# Patient Record
Sex: Male | Born: 1955 | Race: White | Hispanic: No | Marital: Married | Smoking: Never smoker
Health system: Southern US, Community
[De-identification: ages and names within clinical notes are randomized; demographics above are authoritative.]

## PROBLEM LIST (undated history)

## (undated) DIAGNOSIS — R072 Precordial pain: Secondary | ICD-10-CM

## (undated) DIAGNOSIS — R0609 Other forms of dyspnea: Secondary | ICD-10-CM

## (undated) DIAGNOSIS — R9431 Abnormal electrocardiogram [ECG] [EKG]: Secondary | ICD-10-CM

## (undated) DIAGNOSIS — R202 Paresthesia of skin: Secondary | ICD-10-CM

## (undated) DIAGNOSIS — K649 Unspecified hemorrhoids: Secondary | ICD-10-CM

## (undated) DIAGNOSIS — R972 Elevated prostate specific antigen [PSA]: Secondary | ICD-10-CM

## (undated) DIAGNOSIS — R2230 Localized swelling, mass and lump, unspecified upper limb: Secondary | ICD-10-CM

## (undated) DIAGNOSIS — I700 Atherosclerosis of aorta: Secondary | ICD-10-CM

## (undated) DIAGNOSIS — K219 Gastro-esophageal reflux disease without esophagitis: Secondary | ICD-10-CM

## (undated) DIAGNOSIS — R0683 Snoring: Secondary | ICD-10-CM

## (undated) DIAGNOSIS — M25519 Pain in unspecified shoulder: Secondary | ICD-10-CM

## (undated) DIAGNOSIS — R0600 Dyspnea, unspecified: Secondary | ICD-10-CM

## (undated) DIAGNOSIS — G8929 Other chronic pain: Secondary | ICD-10-CM

## (undated) DIAGNOSIS — R1011 Right upper quadrant pain: Secondary | ICD-10-CM

## (undated) DIAGNOSIS — R319 Hematuria, unspecified: Secondary | ICD-10-CM

## (undated) DIAGNOSIS — N182 Chronic kidney disease, stage 2 (mild): Secondary | ICD-10-CM

## (undated) DIAGNOSIS — R609 Edema, unspecified: Secondary | ICD-10-CM

## (undated) DIAGNOSIS — R0602 Shortness of breath: Secondary | ICD-10-CM

## (undated) DIAGNOSIS — R079 Chest pain, unspecified: Secondary | ICD-10-CM

## (undated) DIAGNOSIS — E785 Hyperlipidemia, unspecified: Secondary | ICD-10-CM

## (undated) DIAGNOSIS — R109 Unspecified abdominal pain: Secondary | ICD-10-CM

## (undated) DIAGNOSIS — D229 Melanocytic nevi, unspecified: Secondary | ICD-10-CM

## (undated) HISTORY — DX: Other chronic pain: G89.29

## (undated) HISTORY — DX: Shortness of breath: R06.02

## (undated) HISTORY — DX: Hyperlipidemia, unspecified: E78.5

## (undated) HISTORY — DX: Atherosclerosis of aorta: I70.0

## (undated) HISTORY — DX: Precordial pain: R07.2

## (undated) HISTORY — DX: Melanocytic nevi, unspecified: D22.9

## (undated) HISTORY — DX: Paresthesia of skin: R20.2

## (undated) HISTORY — DX: Abnormal electrocardiogram (ECG) (EKG): R94.31

## (undated) HISTORY — DX: Snoring: R06.83

## (undated) HISTORY — DX: Elevated prostate specific antigen (PSA): R97.20

## (undated) HISTORY — PX: HERNIA REPAIR: SHX51

## (undated) HISTORY — DX: Unspecified hemorrhoids: K64.9

## (undated) HISTORY — DX: Pain in unspecified shoulder: M25.519

## (undated) HISTORY — DX: Edema, unspecified: R60.9

## (undated) HISTORY — DX: Dyspnea, unspecified: R06.00

## (undated) HISTORY — DX: Chest pain, unspecified: R07.9

## (undated) HISTORY — DX: Right upper quadrant pain: R10.11

## (undated) HISTORY — DX: Unspecified abdominal pain: R10.9

## (undated) HISTORY — DX: Chronic kidney disease, stage 2 (mild): N18.2

## (undated) HISTORY — DX: Other forms of dyspnea: R06.09

## (undated) HISTORY — DX: Hematuria, unspecified: R31.9

---

## 2013-07-20 ENCOUNTER — Encounter: Payer: Self-pay | Admitting: Internal Medicine

## 2013-09-12 ENCOUNTER — Encounter: Payer: Self-pay | Admitting: Internal Medicine

## 2014-07-27 ENCOUNTER — Encounter: Payer: Self-pay | Admitting: Internal Medicine

## 2014-08-25 ENCOUNTER — Encounter: Payer: Self-pay | Admitting: Internal Medicine

## 2015-08-13 ENCOUNTER — Other Ambulatory Visit: Payer: Self-pay | Admitting: General Surgery

## 2015-09-28 ENCOUNTER — Encounter (HOSPITAL_BASED_OUTPATIENT_CLINIC_OR_DEPARTMENT_OTHER): Payer: Self-pay | Admitting: *Deleted

## 2015-10-04 ENCOUNTER — Ambulatory Visit (HOSPITAL_BASED_OUTPATIENT_CLINIC_OR_DEPARTMENT_OTHER)
Admission: RE | Admit: 2015-10-04 | Discharge: 2015-10-04 | Disposition: A | Discharge status: Home / Self Care | Payer: BLUE CROSS/BLUE SHIELD | Source: Ambulatory Visit | Attending: General Surgery | Admitting: General Surgery

## 2015-10-04 ENCOUNTER — Encounter (HOSPITAL_BASED_OUTPATIENT_CLINIC_OR_DEPARTMENT_OTHER)
Admission: RE | Disposition: A | Discharge status: Home / Self Care | Payer: Self-pay | Source: Ambulatory Visit | Attending: General Surgery

## 2015-10-04 ENCOUNTER — Ambulatory Visit (HOSPITAL_BASED_OUTPATIENT_CLINIC_OR_DEPARTMENT_OTHER): Payer: BLUE CROSS/BLUE SHIELD | Admitting: Anesthesiology

## 2015-10-04 ENCOUNTER — Encounter (HOSPITAL_BASED_OUTPATIENT_CLINIC_OR_DEPARTMENT_OTHER): Payer: Self-pay | Admitting: Anesthesiology

## 2015-10-04 DIAGNOSIS — D367 Benign neoplasm of other specified sites: Secondary | ICD-10-CM | POA: Insufficient documentation

## 2015-10-04 DIAGNOSIS — R222 Localized swelling, mass and lump, trunk: Secondary | ICD-10-CM | POA: Diagnosis present

## 2015-10-04 HISTORY — PX: MASS EXCISION: SHX2000

## 2015-10-04 HISTORY — DX: Localized swelling, mass and lump, unspecified upper limb: R22.30

## 2015-10-04 HISTORY — DX: Gastro-esophageal reflux disease without esophagitis: K21.9

## 2015-10-04 SURGERY — EXCISION MASS
Anesthesia: Monitor Anesthesia Care | Site: Axilla | Laterality: Left

## 2015-10-04 MED ORDER — LIDOCAINE 2% (20 MG/ML) 5 ML SYRINGE
INTRAMUSCULAR | Status: AC
  Filled 2015-10-04: qty 5

## 2015-10-04 MED ORDER — LIDOCAINE-EPINEPHRINE (PF) 1 %-1:200000 IJ SOLN
INTRAMUSCULAR | Status: DC | PRN
  Administered 2015-10-04: 5.25 mL

## 2015-10-04 MED ORDER — PROPOFOL 10 MG/ML IV BOLUS
INTRAVENOUS | Status: DC | PRN
  Administered 2015-10-04 (×4): 20 mg via INTRAVENOUS

## 2015-10-04 MED ORDER — CEFAZOLIN SODIUM-DEXTROSE 2-4 GM/100ML-% IV SOLN
INTRAVENOUS | Status: AC
  Filled 2015-10-04: qty 100

## 2015-10-04 MED ORDER — OXYCODONE HCL 5 MG PO TABS: 5.0000 mg | ORAL_TABLET | Freq: Once | ORAL | Status: DC | PRN

## 2015-10-04 MED ORDER — ONDANSETRON HCL 4 MG/2ML IJ SOLN
INTRAMUSCULAR | Status: AC
  Filled 2015-10-04: qty 2

## 2015-10-04 MED ORDER — FENTANYL CITRATE (PF) 100 MCG/2ML IJ SOLN
INTRAMUSCULAR | Status: AC
  Filled 2015-10-04: qty 2

## 2015-10-04 MED ORDER — MIDAZOLAM HCL 2 MG/2ML IJ SOLN
1.0000 mg | INTRAMUSCULAR | Status: DC | PRN
  Administered 2015-10-04: 2 mg via INTRAVENOUS

## 2015-10-04 MED ORDER — FENTANYL CITRATE (PF) 100 MCG/2ML IJ SOLN
50.0000 ug | INTRAMUSCULAR | Status: DC | PRN
  Administered 2015-10-04: 100 ug via INTRAVENOUS

## 2015-10-04 MED ORDER — EPHEDRINE 5 MG/ML INJ
INTRAVENOUS | Status: AC
  Filled 2015-10-04: qty 10

## 2015-10-04 MED ORDER — LACTATED RINGERS IV SOLN
INTRAVENOUS | Status: DC
  Administered 2015-10-04 (×2): via INTRAVENOUS

## 2015-10-04 MED ORDER — CEFAZOLIN SODIUM-DEXTROSE 2-4 GM/100ML-% IV SOLN
2.0000 g | INTRAVENOUS | Status: AC
  Administered 2015-10-04: 2 g via INTRAVENOUS

## 2015-10-04 MED ORDER — BUPIVACAINE HCL (PF) 0.25 % IJ SOLN
INTRAMUSCULAR | Status: DC | PRN
  Administered 2015-10-04: 5.25 mL/h

## 2015-10-04 MED ORDER — MIDAZOLAM HCL 2 MG/2ML IJ SOLN
INTRAMUSCULAR | Status: AC
  Filled 2015-10-04: qty 2

## 2015-10-04 MED ORDER — OXYCODONE HCL 5 MG/5ML PO SOLN: 5.0000 mg | Freq: Once | ORAL | Status: DC | PRN

## 2015-10-04 MED ORDER — GLYCOPYRROLATE 0.2 MG/ML IJ SOLN: 0.2000 mg | Freq: Once | INTRAMUSCULAR | Status: DC | PRN

## 2015-10-04 MED ORDER — SCOPOLAMINE 1 MG/3DAYS TD PT72: 1.0000 | MEDICATED_PATCH | Freq: Once | TRANSDERMAL | Status: DC | PRN

## 2015-10-04 MED ORDER — HYDROCODONE-ACETAMINOPHEN 5-325 MG PO TABS: 1.0000 | ORAL_TABLET | ORAL | Status: DC | PRN

## 2015-10-04 MED ORDER — FENTANYL CITRATE (PF) 100 MCG/2ML IJ SOLN: 25.0000 ug | INTRAMUSCULAR | Status: DC | PRN

## 2015-10-04 MED ORDER — SUCCINYLCHOLINE CHLORIDE 200 MG/10ML IV SOSY
PREFILLED_SYRINGE | INTRAVENOUS | Status: AC
  Filled 2015-10-04: qty 10

## 2015-10-04 MED ORDER — ONDANSETRON HCL 4 MG/2ML IJ SOLN: 4.0000 mg | Freq: Four times a day (QID) | INTRAMUSCULAR | Status: DC | PRN

## 2015-10-04 MED ORDER — PHENYLEPHRINE 40 MCG/ML (10ML) SYRINGE FOR IV PUSH (FOR BLOOD PRESSURE SUPPORT)
PREFILLED_SYRINGE | INTRAVENOUS | Status: AC
  Filled 2015-10-04: qty 10

## 2015-10-04 SURGICAL SUPPLY — 45 items
BLADE HEX COATED 2.75 (ELECTRODE) ×3 IMPLANT
BLADE SURG 10 STRL SS (BLADE) ×1 IMPLANT
BLADE SURG 15 STRL LF DISP TIS (BLADE) ×1 IMPLANT
BLADE SURG 15 STRL SS (BLADE) ×3
CANISTER SUCT 1200ML W/VALVE (MISCELLANEOUS) IMPLANT
CHLORAPREP W/TINT 26ML (MISCELLANEOUS) ×3 IMPLANT
COVER BACK TABLE 60X90IN (DRAPES) IMPLANT
COVER MAYO STAND STRL (DRAPES) IMPLANT
DECANTER SPIKE VIAL GLASS SM (MISCELLANEOUS) IMPLANT
DRAPE LAPAROTOMY 100X72 PEDS (DRAPES) IMPLANT
DRAPE UTILITY XL STRL (DRAPES) ×3 IMPLANT
ELECT REM PT RETURN 9FT ADLT (ELECTROSURGICAL) ×3
ELECTRODE REM PT RTRN 9FT ADLT (ELECTROSURGICAL) ×1 IMPLANT
GLOVE BIO SURGEON STRL SZ 6 (GLOVE) ×3 IMPLANT
GLOVE BIOGEL PI IND STRL 6.5 (GLOVE) ×1 IMPLANT
GLOVE BIOGEL PI INDICATOR 6.5 (GLOVE) ×2
GLOVE SURG SS PI 7.0 STRL IVOR (GLOVE) ×2 IMPLANT
GOWN STRL REUS W/ TWL LRG LVL3 (GOWN DISPOSABLE) ×1 IMPLANT
GOWN STRL REUS W/TWL 2XL LVL3 (GOWN DISPOSABLE) ×3 IMPLANT
GOWN STRL REUS W/TWL LRG LVL3 (GOWN DISPOSABLE) ×3
LIQUID BAND (GAUZE/BANDAGES/DRESSINGS) ×3 IMPLANT
NDL HYPO 25X1 1.5 SAFETY (NEEDLE) ×1 IMPLANT
NEEDLE HYPO 25X1 1.5 SAFETY (NEEDLE) ×3 IMPLANT
NS IRRIG 1000ML POUR BTL (IV SOLUTION) IMPLANT
PACK BASIN DAY SURGERY FS (CUSTOM PROCEDURE TRAY) ×3 IMPLANT
PACK UNIVERSAL I (CUSTOM PROCEDURE TRAY) ×2 IMPLANT
PENCIL BUTTON HOLSTER BLD 10FT (ELECTRODE) ×3 IMPLANT
SLEEVE SCD COMPRESS KNEE MED (MISCELLANEOUS) IMPLANT
SPONGE GAUZE 4X4 12PLY STER LF (GAUZE/BANDAGES/DRESSINGS) ×3 IMPLANT
SPONGE LAP 18X18 X RAY DECT (DISPOSABLE) ×3 IMPLANT
STAPLER VISISTAT 35W (STAPLE) IMPLANT
SUT MNCRL AB 4-0 PS2 18 (SUTURE) ×3 IMPLANT
SUT SILK 3 0 TIES 17X18 (SUTURE)
SUT SILK 3-0 18XBRD TIE BLK (SUTURE) IMPLANT
SUT VIC AB 2-0 SH 27 (SUTURE)
SUT VIC AB 2-0 SH 27XBRD (SUTURE) IMPLANT
SUT VIC AB 3-0 SH 27 (SUTURE) ×3
SUT VIC AB 3-0 SH 27X BRD (SUTURE) ×1 IMPLANT
SUT VICRYL 4-0 PS2 18IN ABS (SUTURE) ×3 IMPLANT
SYR CONTROL 10ML LL (SYRINGE) ×3 IMPLANT
TOWEL OR 17X24 6PK STRL BLUE (TOWEL DISPOSABLE) ×3 IMPLANT
TOWEL OR NON WOVEN STRL DISP B (DISPOSABLE) ×3 IMPLANT
TUBE CONNECTING 20'X1/4 (TUBING)
TUBE CONNECTING 20X1/4 (TUBING) IMPLANT
YANKAUER SUCT BULB TIP NO VENT (SUCTIONS) IMPLANT

## 2015-10-04 NOTE — Discharge Instructions (Addendum)
[place] Office Phone Number [phone]   POST OP INSTRUCTIONS  Always review your discharge instruction sheet given to you by the facility where your surgery was performed.  IF YOU HAVE DISABILITY OR FAMILY LEAVE FORMS, YOU MUST BRING THEM TO THE OFFICE FOR PROCESSING.  DO NOT GIVE THEM TO YOUR DOCTOR.  1. A prescription for pain medication may be given to you upon discharge.  Take your pain medication as prescribed, if needed.  If narcotic pain medicine is not needed, then you may take acetaminophen (Tylenol) or ibuprofen (Advil) as needed. 2. Take your usually prescribed medications unless otherwise directed 3. If you need a refill on your pain medication, please contact your pharmacy.  They will contact our office to request authorization.  Prescriptions will not be filled after 5pm or on week-ends. 4. You should eat very light the first 24 hours after surgery, such as soup, crackers, pudding, etc.  Resume your normal diet the day after surgery 5. It is common to experience some constipation if taking pain medication after surgery.  Increasing fluid intake and taking a stool softener will usually help or prevent this problem from occurring.  A mild laxative (Milk of Magnesia or Miralax) should be taken according to package directions if there are no bowel movements after 48 hours. 6. You may shower in 48 hours.  The surgical glue will flake off in 2-3 weeks.   7. ACTIVITIES:  No strenuous activity or heavy lifting for 1 week.   a. You may drive when you no longer are taking prescription pain medication, you can comfortably wear a seatbelt, and you can safely maneuver your car and apply brakes. b. RETURN TO WORK:  __________as tolerated.  _______________ Dennis Bast should see your doctor in the office for a follow-up appointment approximately three-four weeks after your surgery.    WHEN TO CALL YOUR DOCTOR: 1. Fever over 101.0 2. Nausea and/or vomiting. 3. Extreme swelling or  bruising. 4. Continued bleeding from incision. 5. Increased pain, redness, or drainage from the incision.  The clinic staff is available to answer your questions during regular business hours.  Please dont hesitate to call and ask to speak to one of the nurses for clinical concerns.  If you have a medical emergency, go to the nearest emergency room or call 911.  A surgeon from [facility] is always on call at the hospital.  For further questions, please visit [email or web address]    Post Anesthesia Home Care Instructions  Activity: Get plenty of rest for the remainder of the day. A responsible adult should stay with you for 24 hours following the procedure.  For the next 24 hours, DO NOT: -Drive a car -Paediatric nurse -Drink alcoholic beverages -Take any medication unless instructed by your physician -Make any legal decisions or sign important papers.  Meals: Start with liquid foods such as gelatin or soup. Progress to regular foods as tolerated. Avoid greasy, spicy, heavy foods. If nausea and/or vomiting occur, drink only clear liquids until the nausea and/or vomiting subsides. Call your physician if vomiting continues.  Special Instructions/Symptoms: Your throat may feel dry or sore from the anesthesia or the breathing tube placed in your throat during surgery. If this causes discomfort, gargle with warm salt water. The discomfort should disappear within 24 hours.  If you had a scopolamine patch placed behind your ear for the management of post- operative nausea and/or vomiting:  1. The medication in the patch is effective for 72 hours, after which  which it should be removed.  Wrap patch in a tissue and discard in the trash. Wash hands thoroughly with soap and water. °2. You may remove the patch earlier than 72 hours if you experience unpleasant side effects which may include dry mouth, dizziness or visual disturbances. °3. Avoid touching the patch. Wash your  hands with soap and water after contact with the patch. °  ° ° °

## 2015-10-04 NOTE — Anesthesia Postprocedure Evaluation (Signed)
Anesthesia Post Note  Patient: Timothy Watts  Procedure(s) Performed: Procedure(s) (LRB): EXCISION OF LEFT AXILLARY MASS (Left)  Patient location during evaluation: PACU Anesthesia Type: MAC Level of consciousness: awake and alert Pain management: pain level controlled Vital Signs Assessment: post-procedure vital signs reviewed and stable Respiratory status: spontaneous breathing, nonlabored ventilation, respiratory function stable and patient connected to nasal cannula oxygen Cardiovascular status: stable and blood pressure returned to baseline Anesthetic complications: no    Last Vitals:  Filed Vitals:   10/04/15 1030 10/04/15 1034  BP: 124/86   Pulse: 59 61  Temp:    Resp: 13 14    Last Pain:  Filed Vitals:   10/04/15 1034  PainSc: 0-No pain                 Chanae Gemma S

## 2015-10-04 NOTE — Transfer of Care (Signed)
Immediate Anesthesia Transfer of Care Note  Patient: Timothy Watts  Procedure(s) Performed: Procedure(s): EXCISION OF LEFT AXILLARY MASS (Left)  Patient Location: PACU  Anesthesia Type:MAC  Level of Consciousness: awake, alert  and oriented  Airway & Oxygen Therapy: Patient Spontanous Breathing and Patient connected to face mask oxygen  Post-op Assessment: Report given to RN and Post -op Vital signs reviewed and stable  Post vital signs: Reviewed and stable  Last Vitals:  Filed Vitals:   10/04/15 0747  BP: 117/79  Pulse: 59  Temp: 36.6 C  Resp: 18    Last Pain: There were no vitals filed for this visit.       Complications: No apparent anesthesia complications

## 2015-10-04 NOTE — Interval H&P Note (Signed)
History and Physical Interval Note:  10/04/2015 9:01 AM  Timothy Watts  has presented today for surgery, with the diagnosis of left subcutaneous axillary mass  The various methods of treatment have been discussed with the patient and family. After consideration of risks, benefits and other options for treatment, the patient has consented to  Procedure(s): EXCISION OF LEFT AXILLARY MASS (Left) as a surgical intervention .  The patient's history has been reviewed, patient examined, no change in status, stable for surgery.  I have reviewed the patient's chart and labs.  Questions were answered to the patient's satisfaction.     Niala Stcharles

## 2015-10-04 NOTE — Anesthesia Preprocedure Evaluation (Signed)
Anesthesia Evaluation  Patient identified by MRN, date of birth, ID band Patient awake    Reviewed: Allergy & Precautions, NPO status , Patient's Chart, lab work & pertinent test results  Airway Mallampati: II   Neck ROM: full    Dental   Pulmonary neg pulmonary ROS,    breath sounds clear to auscultation       Cardiovascular negative cardio ROS   Rhythm:regular Rate:Normal     Neuro/Psych    GI/Hepatic GERD  ,  Endo/Other    Renal/GU      Musculoskeletal   Abdominal   Peds  Hematology   Anesthesia Other Findings   Reproductive/Obstetrics                             Anesthesia Physical Anesthesia Plan  ASA: II  Anesthesia Plan: MAC   Post-op Pain Management:    Induction: Intravenous  Airway Management Planned: Simple Face Mask  Additional Equipment:   Intra-op Plan:   Post-operative Plan:   Informed Consent: I have reviewed the patients History and Physical, chart, labs and discussed the procedure including the risks, benefits and alternatives for the proposed anesthesia with the patient or authorized representative who has indicated his/her understanding and acceptance.     Plan Discussed with: CRNA, Anesthesiologist and Surgeon  Anesthesia Plan Comments:         Anesthesia Quick Evaluation

## 2015-10-04 NOTE — H&P (Signed)
Timothy Watts Location: [facility] Patient #: [id number] DOB: [birth year] Married / Language: English / Race: White Male   History of Present Illness The patient is a 60 year old male who presents with a complaint of Mass. Patient presents in consultation at the request of Dr. Philip Aspen due to a left axillary mass. This has been there for 1 - 1 1/2 years. He is not sure if it has gotten larger. He denies any trauma to the arm. He has no personal history of cancer. He denies any removal of moles on that side. He has no weight loss, fevers, or night sweats. He owns a supply store.    Other Problems Back Pain Hemorrhoids  Diagnostic Studies History Colonoscopy within last year  Allergies No Known Drug Allergies04/17/2017  Medication History  Sulfamethoxazole-Trimethoprim (800-160MG  Tablet, Oral) Active. Medications Reconciled  Social History  Alcohol use Occasional alcohol use. Caffeine use Carbonated beverages. No drug use Tobacco use Never smoker.  Family History Cancer Sister. Malignant Neoplasm Of Pancreas Mother. Prostate Cancer Father.    Review of Systems General Not Present- Appetite Loss, Chills, Fatigue, Fever, Night Sweats, Weight Gain and Weight Loss. Skin Not Present- Change in Wart/Mole, Dryness, Hives, Jaundice, New Lesions, Non-Healing Wounds, Rash and Ulcer. HEENT Present- Seasonal Allergies and Wears glasses/contact lenses. Not Present- Earache, Hearing Loss, Hoarseness, Nose Bleed, Oral Ulcers, Ringing in the Ears, Sinus Pain, Sore Throat, Visual Disturbances and Yellow Eyes. Respiratory Present- Snoring. Not Present- Bloody sputum, Chronic Cough, Difficulty Breathing and Wheezing. Breast Not Present- Breast Mass, Breast Pain, Nipple Discharge and Skin Changes. Cardiovascular Not Present- Chest Pain, Difficulty Breathing Lying Down, Leg Cramps, Palpitations, Rapid Heart Rate, Shortness of Breath and Swelling of  Extremities. Gastrointestinal Present- Hemorrhoids. Not Present- Abdominal Pain, Bloating, Bloody Stool, Change in Bowel Habits, Chronic diarrhea, Constipation, Difficulty Swallowing, Excessive gas, Gets full quickly at meals, Indigestion, Nausea, Rectal Pain and Vomiting. Male Genitourinary Not Present- Blood in Urine, Change in Urinary Stream, Frequency, Impotence, Nocturia, Painful Urination, Urgency and Urine Leakage. Musculoskeletal Present- Back Pain. Not Present- Joint Pain, Joint Stiffness, Muscle Pain, Muscle Weakness and Swelling of Extremities. Neurological Not Present- Decreased Memory, Fainting, Headaches, Numbness, Seizures, Tingling, Tremor, Trouble walking and Weakness. Psychiatric Not Present- Anxiety, Bipolar, Change in Sleep Pattern, Depression, Fearful and Frequent crying. Endocrine Not Present- Cold Intolerance, Excessive Hunger, Hair Changes, Heat Intolerance, Hot flashes and New Diabetes. Hematology Not Present- Easy Bruising, Excessive bleeding, Gland problems, HIV and Persistent Infections.  Vitals  Height: 69in Temp.: 7F(Temporal)  Pulse: 79 (Regular)  BP: 118/76 (Sitting, Left Arm, Standard)    Physical Exam General Mental Status-Alert. General Appearance-Consistent with stated age. Hydration-Well hydrated. Voice-Normal.  Integumentary Note: there is a 1x 2 cm soft mass in the distal portion of the left posterior axilla. It feels like this is potentially superficial. It does not feel like a epidermal inclusion cyst. It is also a little posterior to be a LN.   Head and Neck Head-normocephalic, atraumatic with no lesions or palpable masses. Trachea-midline. Thyroid Gland Characteristics - normal size and consistency.  Eye Eyeball - Bilateral-Extraocular movements intact. Sclera/Conjunctiva - Bilateral-No scleral icterus.  Chest and Lung Exam Chest and lung exam reveals -quiet, even and easy respiratory effort with no use of  accessory muscles and on auscultation, normal breath sounds, no adventitious sounds and normal vocal resonance. Inspection Chest Wall - Normal. Back - normal.  Cardiovascular Cardiovascular examination reveals -normal heart sounds, regular rate and rhythm with no murmurs and normal pedal pulses  bilaterally.  Abdomen Inspection Inspection of the abdomen reveals - No Hernias. Palpation/Percussion Palpation and Percussion of the abdomen reveal - Soft, Non Tender, No Rebound tenderness, No Rigidity (guarding) and No hepatosplenomegaly. Auscultation Auscultation of the abdomen reveals - Bowel sounds normal.  Neurologic Neurologic evaluation reveals -alert and oriented x 3 with no impairment of recent or remote memory. Mental Status-Normal.  Musculoskeletal Global Assessment -Note: no gross deformities.  Normal Exam - Left-Upper Extremity Strength Normal and Lower Extremity Strength Normal. Normal Exam - Right-Upper Extremity Strength Normal and Lower Extremity Strength Normal.  Lymphatic Head & Neck  General Head & Neck Lymphatics: Bilateral - Description - Normal. Axillary  General Axillary Region: Bilateral - Description - Normal. Tenderness - Non Tender. Femoral & Inguinal  Generalized Femoral & Inguinal Lymphatics: Bilateral - Description - No Generalized lymphadenopathy.    Assessment & Plan AXILLARY MASS, LEFT (R22.32) Impression: This is unlikely to be malignant, but is in an unusual location. I have seen breast cancer present in this location. Also, masses in this location can be deeper than they appear.  We will plan excision with light sedation and local.  Discussed risks and benefits. Pt wishes to proceed. Current Plans You are being scheduled for surgery - Our schedulers will call you.  You should hear from our office's scheduling department within 5 working days about the location, date, and time of surgery. We try to make accommodations for  patient's preferences in scheduling surgery, but sometimes the OR schedule or the surgeon's schedule prevents [place] from making those accommodations.  If you have not heard from our office [phone]) in 5 working days, call the office and ask for your surgeon's nurse.  If you have other questions about your diagnosis, plan, or surgery, call the office and ask for your surgeon's nurse.    Signed by Stark Klein, MD (08/13/2015 2:14 PM)

## 2015-10-04 NOTE — Op Note (Signed)
PRE-OPERATIVE DIAGNOSIS: left axillary mass  POST-OPERATIVE DIAGNOSIS:  Same  PROCEDURE:  Procedure(s): Excision of axillary mass, subcutaneous, 1.5 cm  SURGEON:  Surgeon(s): Stark Klein, MD  ANESTHESIA:   local and IV sedation  DRAINS: none   LOCAL MEDICATIONS USED:  MARCAINE    and LIDOCAINE   SPECIMEN:  Source of Specimen:  left axillary mass  DISPOSITION OF SPECIMEN:  PATHOLOGY  COUNTS:  YES  DICTATION: .Dragon Dictation  PLAN OF CARE: Discharge to home after PACU  PATIENT DISPOSITION:  PACU - hemodynamically stable.  FINDINGS:  Fatty mass c/w lipoma  EBL: min  PROCEDURE:   Pt was identified in the holding area then taken to the OR and placed supine on the operating room table.  General anesthesia was induced.  His left upper chest and arm were prepped and draped in sterile fashion. Timeout was performed according to the surgical safely checklist.    Local anesthesia was administered.  A incision was made in the skin lines of the axilla. Skin flaps were created with the cautery and skin hooks. The mass was just under the skin was was removed with the cautery.  The area was palpated to make sure no residual mass was remaining.  Hemostasis was achieved with cautery.  The skin was closed with 3-0 vicryl deep dermal interrupted suture and 4-0 monocryl in running subcuticular fashion.  The wound was then cleaned, dried, and dressed with dermabond.    Needle, sponge, and instrument counts were correct x 2.

## 2015-10-05 ENCOUNTER — Encounter (HOSPITAL_BASED_OUTPATIENT_CLINIC_OR_DEPARTMENT_OTHER): Payer: Self-pay | Admitting: General Surgery

## 2015-10-08 NOTE — Progress Notes (Signed)
Quick Note:  Please let patient know that pathology is benign. ______ 

## 2017-06-25 ENCOUNTER — Other Ambulatory Visit: Payer: Self-pay | Admitting: Internal Medicine

## 2017-06-25 DIAGNOSIS — R109 Unspecified abdominal pain: Secondary | ICD-10-CM

## 2017-06-25 DIAGNOSIS — R3121 Asymptomatic microscopic hematuria: Secondary | ICD-10-CM

## 2017-06-26 ENCOUNTER — Ambulatory Visit
Admission: RE | Admit: 2017-06-26 | Discharge: 2017-06-26 | Disposition: A | Discharge status: Home / Self Care | Payer: PRIVATE HEALTH INSURANCE | Source: Ambulatory Visit | Attending: Internal Medicine | Admitting: Internal Medicine

## 2017-06-26 DIAGNOSIS — R109 Unspecified abdominal pain: Secondary | ICD-10-CM

## 2017-06-26 DIAGNOSIS — R3121 Asymptomatic microscopic hematuria: Secondary | ICD-10-CM

## 2019-12-22 LAB — IFOBT (OCCULT BLOOD): IFOBT: NEGATIVE

## 2020-07-08 NOTE — Progress Notes (Signed)
CARDIOLOGY CONSULT NOTE       Patient ID: Timothy Watts MRN: [id number] DOB/AGE: 1955-06-13 65 y.o.  Admit date: (Not on file) Referring Physician: Philip Aspen Primary Physician: Leanna Battles, MD Primary Cardiologist: New Reason for Consultation: Abnormal ECG   Active Problems:   * No active hospital problems. *   HPI:  65 y.o. referred by Dr Philip Aspen for abnormal ECG He has been having exertional dyspnea Had ECG as part of insurance physical and told it was abnormal Gets winded going up stairs and playing tennis Also rides elliptical 3x/week No chest pain , palpitations, edema Non smoker No family history of premature CAD LDL is 127 not on statin Rx Had CT abdomen for flank pain 2019 noted some aortic atherosclerosis Review of ECG from primary 06/25/20 showed SR rate 79 with LBBB  No history of cardiac issues Father alive at 42 no family history. 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 Place golf at [employer] and [place] Has a house at [facility] and enjoys [employer]. Has two children both accountants   ROS All other systems reviewed and negative except as noted above  Past Medical History:  Diagnosis Date  . Abnormal EKG   . Atherosclerosis of aorta ([place])   . Axillary mass    left  . Benign skin mole   . Chest pain   . CKD (chronic kidney disease), stage II   . Dyspnea on exertion   . Elbow pain, chronic, right   . Flank pain   . GERD (gastroesophageal reflux disease)   . Hematuria   . Hemorrhoids   . Hyperlipidemia   . Paresthesia   . Precordial pain   . PSA elevation   . PSA elevation   . RUQ pain   . Shoulder pain   . Snoring   . SOB (shortness of breath) on exertion   . Swelling     Family History  Problem Relation Age of Onset  . Pancreatic cancer Mother   . Stroke Father   . Throat cancer Sister     Social History   Socioeconomic History   . Marital status: Married    Spouse name: Not on file  . Number of children: 2  . Years of education: Not on file  . Highest education level: Not on file  Occupational History  . Occupation: Engineer, site  Tobacco Use  . Smoking status: Never Smoker  . Smokeless tobacco: Never Used  Substance and Sexual Activity  . Alcohol use: Yes    Comment: social  . Drug use: No  . Sexual activity: Not on file  Other Topics Concern  . Not on file  Social History Narrative  . Not on file   Social Determinants of Health   Financial Resource Strain: Not on file  Food Insecurity: Not on file  Transportation Needs: Not on file  Physical Activity: Not on file  Stress: Not on file  Social Connections: Not on file  Intimate Partner Violence: Not on file    Past Surgical History:  Procedure Laterality Date  . HERNIA REPAIR     UHR  . MASS EXCISION Left 10/04/2015   Procedure: EXCISION OF LEFT AXILLARY MASS;  Surgeon: Stark Klein, MD;  Location: [facility];  Service: General;  Laterality: Left;     No current outpatient medications on file.  Physical Exam: Blood pressure 110/84, pulse 66, height 5' 9.5" (1.765 m), weight 79.8 kg, SpO2 98 %.   Affect appropriate Healthy:  appears stated age 65: normal Neck supple with no adenopathy JVP normal no bruits no thyromegaly Lungs clear with no wheezing and good diaphragmatic motion Heart:  S1/S2 no murmur, no rub, gallop or click PMI normal Abdomen: benighn, BS positve, no tenderness, no AAA no bruit.  No HSM or HJR Distal pulses intact with no bruits No edema Neuro non-focal Skin warm and dry No muscular weakness   Labs:  No results found for: WBC, HGB, HCT, MCV, PLT No results for input(s): NA, K, CL, CO2, BUN, CREATININE, CALCIUM, PROT, BILITOT, ALKPHOS, ALT, AST, GLUCOSE in the last 168 hours.  Invalid input(s): LABALBU No results found for: CKTOTAL, CKMB, CKMBINDEX, TROPONINI No results found for:  CHOL No results found for: HDL No results found for: LDLCALC No results found for: TRIG No results found for: CHOLHDL No results found for: LDLDIRECT    Radiology: No results found.  EKG: SR LBBB    ASSESSMENT AND PLAN:   1. Abnormal ECG:  New LBBB Need to r/o structural heart disease I.e. cardiomyopathy especially  In absence of chronic HTN.  Echo. R/O CAD with cardiac CTA 2. Dyspnea: seems functional f/u echo  3. HLD:  LDL 127 noted aortic atherosclerosis on CT 2019 will see what calcium score and cardiac CTA look like in regard to need for statin Rx  Cardiac CTA Lopressor 75 mg 2 hours before BMET Echo  F/U with me after testing    Signed: Jenkins Rouge 07/19/2020, 3:54 PM

## 2020-07-19 ENCOUNTER — Other Ambulatory Visit: Payer: Self-pay

## 2020-07-19 ENCOUNTER — Ambulatory Visit (INDEPENDENT_AMBULATORY_CARE_PROVIDER_SITE_OTHER): Payer: No Typology Code available for payment source | Admitting: Cardiovascular Disease

## 2020-07-19 ENCOUNTER — Encounter: Payer: Self-pay | Admitting: Cardiovascular Disease

## 2020-07-19 VITALS — BP 110/84 | HR 66 | Ht 69.5 in | Wt 176.0 lb

## 2020-07-19 DIAGNOSIS — I447 Left bundle-branch block, unspecified: Secondary | ICD-10-CM

## 2020-07-19 DIAGNOSIS — R9431 Abnormal electrocardiogram [ECG] [EKG]: Secondary | ICD-10-CM

## 2020-07-19 DIAGNOSIS — R079 Chest pain, unspecified: Secondary | ICD-10-CM

## 2020-07-19 DIAGNOSIS — R0600 Dyspnea, unspecified: Secondary | ICD-10-CM

## 2020-07-19 MED ORDER — METOPROLOL TARTRATE 50 MG PO TABS: ORAL_TABLET | ORAL | 0 refills | Status: DC

## 2020-07-19 NOTE — Patient Instructions (Signed)
Medication Instructions:  *If you need a refill on your cardiac medications before your next appointment, please call your pharmacy*  Lab Work: Your physician recommends that you return for lab work to have BMET done before cardiac CT.  If you have labs (blood work) drawn today and your tests are completely normal, you will receive your results only by: Marland Kitchen MyChart Message (if you have MyChart) OR . A paper copy in the mail If you have any lab test that is abnormal or we need to change your treatment, we will call you to review the results.  Testing/Procedures: Your physician has requested that you have cardiac CT. Cardiac computed tomography (CT) is a painless test that uses an x-Weylin machine to take clear, detailed pictures of your heart. For further information please visit [email or web address]. Please follow instruction sheet as given.  Your physician has requested that you have an echocardiogram. Echocardiography is a painless test that uses sound waves to create images of your heart. It provides your doctor with information about the size and shape of your heart and how well your heart's chambers and valves are working. This procedure takes approximately one hour. There are no restrictions for this procedure.  Follow-Up: At [facility], you and your health needs are our priority.  As part of our continuing mission to provide you with exceptional heart care, we have created designated Provider Care Teams.  These Care Teams include your primary Cardiologist (physician) and Advanced Practice Providers (APPs -  Physician Assistants and Nurse Practitioners) who all work together to provide you with the care you need, when you need it.  We recommend signing up for the patient portal called "MyChart".  Sign up information is provided on this After Visit Summary.  MyChart is used to connect with patients for Virtual Visits (Telemedicine).  Patients are able to view lab/test results, encounter notes,  upcoming appointments, etc.  Non-urgent messages can be sent to your provider as well.   To learn more about what you can do with MyChart, go to [email or web address].    Your next appointment:   2 month(s)  The format for your next appointment:   In Person  Provider:   You may see Dr. Johnsie Cancel or one of the following Advanced Practice Providers on your designated Care Team:    Kathyrn Drown, NP    Other Instructions Your cardiac CT will be scheduled at one of the below locations:   [facility] [address] [place], [place] [address] [phone]   If scheduled at [facility], please arrive at the [facility] main entrance (entrance A) of [facility] 30 minutes prior to test start time. Proceed to the [facility] Radiology Department (first floor) to check-in and test prep.   Please follow these instructions carefully (unless otherwise directed):  Hold all erectile dysfunction medications at least 3 days (72 hrs) prior to test.  On the Night Before the Test: . Be sure to Drink plenty of water. . Do not consume any caffeinated/decaffeinated beverages or chocolate 12 hours prior to your test. . Do not take any antihistamines 12 hours prior to your test.  On the Day of the Test: . Drink plenty of water until 1 hour prior to the test. . Do not eat any food 4 hours prior to the test. . You may take your regular medications prior to the test.  . Take metoprolol (Lopressor) 75 mg two hours prior to test.   After the Test: .  Drink plenty of water. . After receiving IV contrast, you may experience a mild flushed feeling. This is normal. . On occasion, you may experience a mild rash up to 24 hours after the test. This is not dangerous. If this occurs, you can take Benadryl 25 mg and increase your fluid intake. . If you experience trouble breathing, this can be serious. If it is severe call 911 IMMEDIATELY. If it is mild, please call our  office.  Once we have confirmed authorization from your insurance company, we will call you to set up a date and time for your test. Based on how quickly your insurance processes prior authorizations requests, please allow up to 4 weeks to be contacted for scheduling your Cardiac CT appointment. Be advised that routine Cardiac CT appointments could be scheduled as many as 8 weeks after your provider has ordered it.  For non-scheduling related questions, please contact the cardiac imaging nurse navigator should you have any questions/concerns: Marchia Bond, Cardiac Imaging Nurse Navigator Gordy Clement, Cardiac Imaging Nurse Navigator [facility] Direct Office Dial: [phone]   For scheduling needs, including cancellations and rescheduling, please call [place], [phone].

## 2020-07-27 ENCOUNTER — Telehealth: Payer: Self-pay

## 2020-07-27 NOTE — Telephone Encounter (Signed)
-----   Message from Melony Overly sent at 07/27/2020  3:48 PM EDT ----- Regarding: ct heart Scheduled 08/07/20 at 7:45..   He will need labs done    Thanks, [place]

## 2020-07-27 NOTE — Telephone Encounter (Signed)
Called patient to schedule lab work. Left message to call back. Order is already in for lab work.

## 2020-07-30 NOTE — Telephone Encounter (Signed)
Pt calling back as instructed to last Friday, to schedule his BMET for upcoming Cardiac CT on 4/12.  Scheduled the pt to come into the office for BMET on tomorrow 4/5.  Pt verbalized understanding and agrees with this plan.

## 2020-07-30 NOTE — Telephone Encounter (Signed)
Pt is returning a call for Pam from Friday, he would like a return call on his cell phone please Thanks

## 2020-07-31 ENCOUNTER — Other Ambulatory Visit: Payer: Self-pay

## 2020-07-31 ENCOUNTER — Other Ambulatory Visit: Payer: No Typology Code available for payment source

## 2020-07-31 DIAGNOSIS — R0600 Dyspnea, unspecified: Secondary | ICD-10-CM

## 2020-07-31 DIAGNOSIS — R079 Chest pain, unspecified: Secondary | ICD-10-CM

## 2020-07-31 DIAGNOSIS — I447 Left bundle-branch block, unspecified: Secondary | ICD-10-CM

## 2020-07-31 DIAGNOSIS — R9431 Abnormal electrocardiogram [ECG] [EKG]: Secondary | ICD-10-CM

## 2020-07-31 LAB — BASIC METABOLIC PANEL
BUN/Creatinine Ratio: 16 (ref 10–24)
BUN: 17 mg/dL (ref 8–27)
CO2: 27 mmol/L (ref 20–29)
Calcium: 8.9 mg/dL (ref 8.6–10.2)
Chloride: 104 mmol/L (ref 96–106)
Creatinine, Ser: 1.07 mg/dL (ref 0.76–1.27)
Glucose: 84 mg/dL (ref 65–99)
Potassium: 4.2 mmol/L (ref 3.5–5.2)
Sodium: 142 mmol/L (ref 134–144)
eGFR: 78 mL/min/{1.73_m2} (ref >59)

## 2020-08-03 ENCOUNTER — Telehealth (HOSPITAL_COMMUNITY): Payer: Self-pay | Admitting: Emergency Medicine

## 2020-08-03 NOTE — Telephone Encounter (Signed)
Reaching out to patient to offer assistance regarding upcoming cardiac imaging study; pt verbalizes understanding of appt date/time, parking situation and where to check in, pre-test NPO status and medications ordered, and verified current allergies; name and call back number provided for further questions should they arise Marchia Bond RN Navigator Cardiac Imaging [facility] [phone] office [phone] cell   75mg  metoprolol 2 hr prior

## 2020-08-07 ENCOUNTER — Other Ambulatory Visit: Payer: Self-pay

## 2020-08-07 ENCOUNTER — Encounter: Payer: No Typology Code available for payment source | Admitting: *Deleted

## 2020-08-07 ENCOUNTER — Ambulatory Visit (HOSPITAL_COMMUNITY)
Admission: RE | Admit: 2020-08-07 | Discharge: 2020-08-07 | Disposition: A | Discharge status: Home / Self Care | Payer: No Typology Code available for payment source | Source: Ambulatory Visit | Attending: Cardiovascular Disease | Admitting: Cardiovascular Disease

## 2020-08-07 DIAGNOSIS — R079 Chest pain, unspecified: Secondary | ICD-10-CM

## 2020-08-07 DIAGNOSIS — Z006 Encounter for examination for normal comparison and control in clinical research program: Secondary | ICD-10-CM

## 2020-08-07 IMAGING — CT CT HEART MORP W/ CTA COR W/ SCORE W/ CA W/CM &/OR W/O CM
4 of 7 series · 8 of 20 positions shown, 9 images · IV contrast (APPLIED)
Comparison: CT abdomen and pelvis 06/26/2017
COMPARISON: CT abdomen and pelvis 06/26/2017

Addendum:
EXAM:
OVER-READ INTERPRETATION  CT CHEST

The following report is an over-read performed by radiologist Dr.
Deontae Pringle [REDACTED] on 08/07/2020. This over-read
does not include interpretation of cardiac or coronary anatomy or
pathology. The coronary calcium score/coronary CTA interpretation by
the cardiologist is attached.
HISTORY: Chest pain/anginal equiv, ECGs or troponins abnormal Dyspnea on
exertion (TAT) Cardiomyopathy suspected
Cardiac/Coronary CT
TECHNIQUE: The patient was scanned on a Siemens Force scanner.
PROTOCOL: A 100 kV prospective scan was triggered in the descending thoracic
aorta at 111 HU's. Axial non-contrast 3 mm slices were carried out
through the heart. The data set was analyzed on a dedicated work
station and scored using the Agatson method. Gantry rotation speed
was 250 msecs and collimation was 0.6 mm. Heart rate optimized
medically, and 0.8 mg of sublingual nitroglycerin was given. The 3D
data set was reconstructed in 5% intervals of 35-75% of the R-R
cycle. Diastolic phases were analyzed on a dedicated work station
using MPR, MIP and VRT modes. The patient received 90mL OMNIPAQUE
IOHEXOL 350 MG/ML SOLN of contrast.

[Series 6: best diast · axial · 0.39mm/px · z∈[+1102,+1146]mm · 2 of 331 slices shown, 3 images]
[im 111/331  vessel]
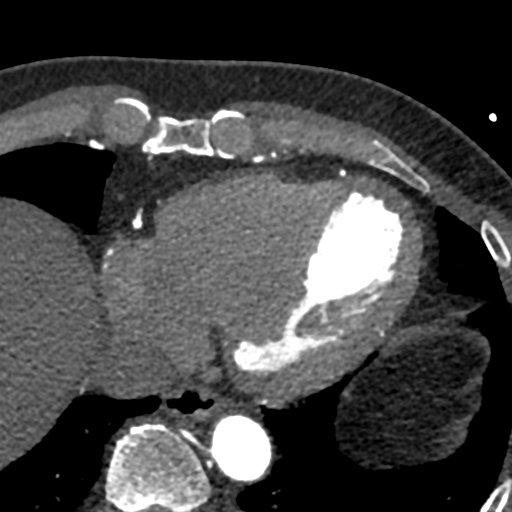
[im 111/331  lung]
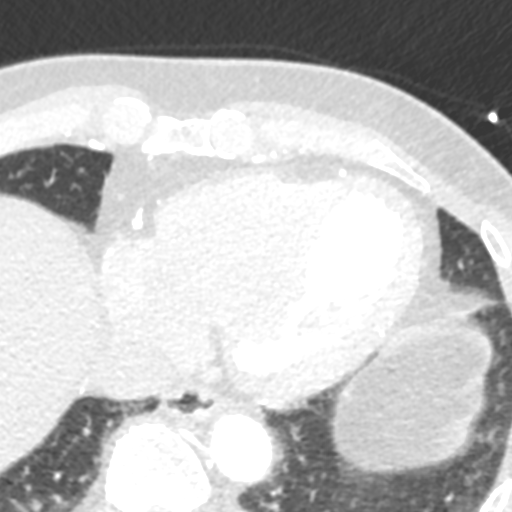
[im 221/331  vessel]
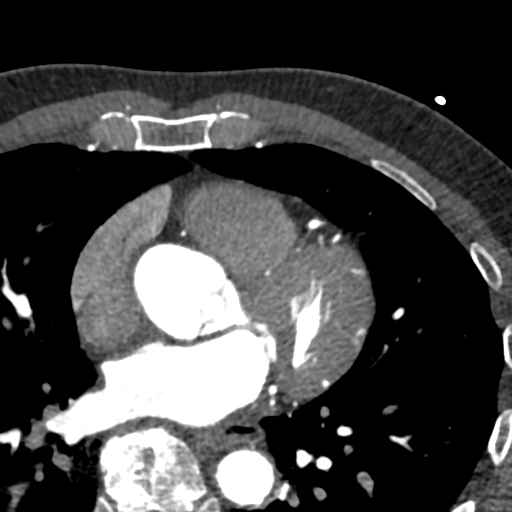

[Series 7: best syst · axial · 0.39mm/px · z∈[+1102,+1146]mm · 2 of 331 slices shown]
[im 111/331  vessel]
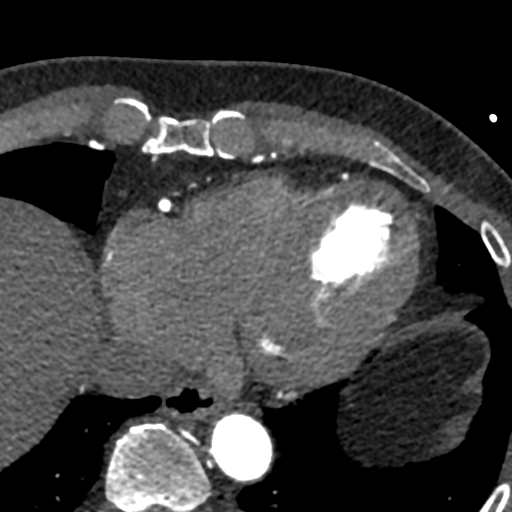
[im 221/331  vessel]
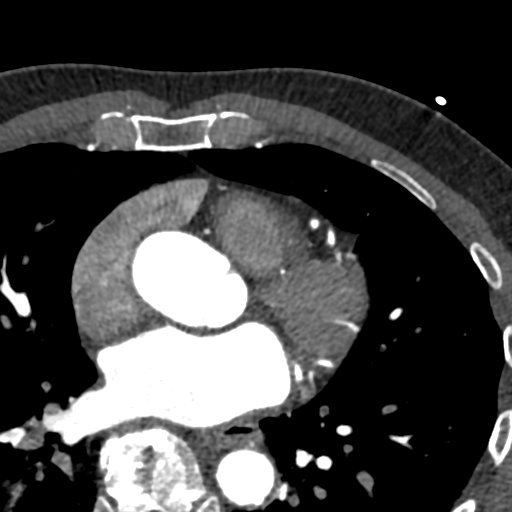

[Series 8: ts diast sharp 75 % · axial · 0.39mm/px · z∈[+1102,+1146]mm · 2 of 331 slices shown]
[im 111/331  lung]
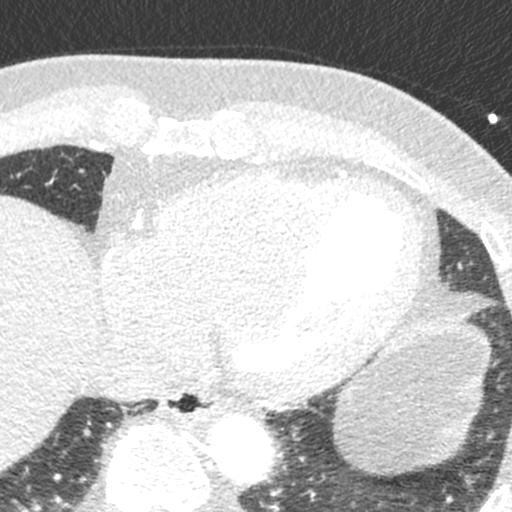
[im 221/331  lung]
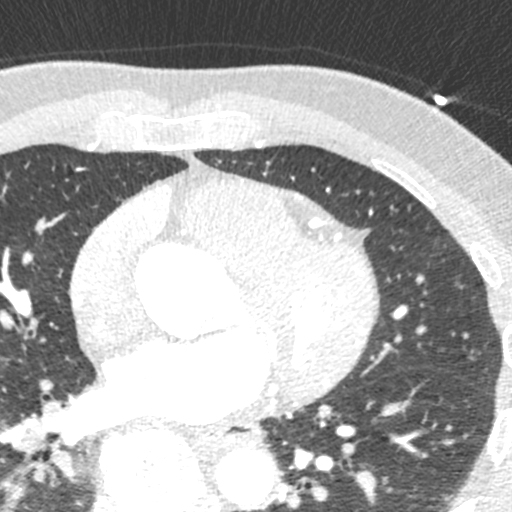

[Series 9: ts syst sharp · axial · 0.39mm/px · z∈[+1102,+1146]mm · 2 of 331 slices shown]
[im 111/331  lung]
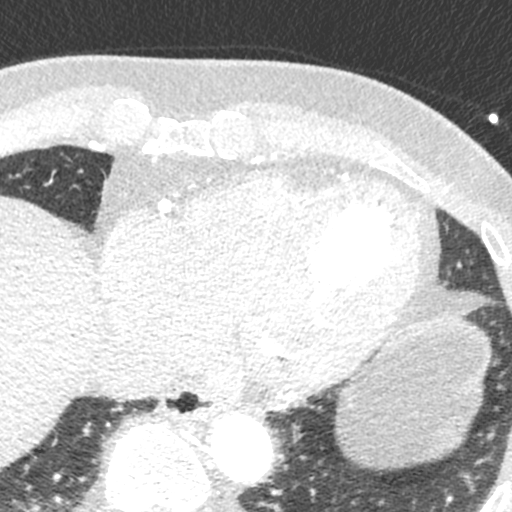
[im 221/331  lung]
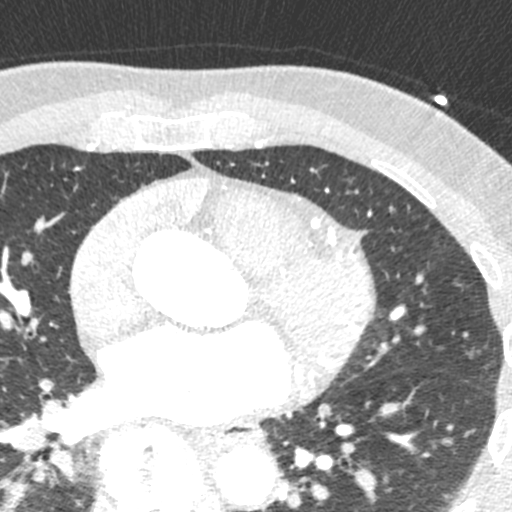

[8 of 20 positions shown; findings below may reference images not displayed]

FINDINGS: Vascular: Ascending thoracic aorta measures 3.8 cm. No evidence for
a dissection involving the visualized thoracic aorta. Pulmonary
arteries are poorly opacified on this examination.

Mediastinum/Nodes: Visualized mediastinal structures are
unremarkable.

Lungs/Pleura: Dependent atelectasis in the lower lobes. No large
pleural effusions.

Upper Abdomen: Visualized upper abdominal structures are
unremarkable.

Musculoskeletal: No acute bone abnormality.
IMPRESSION: No acute abnormality involving the extracardiac structures.
FINDINGS: Coronary calcium score: The patient's coronary artery calcium score
is 2, which places the patient in the 2 percentile.

Coronary arteries: Normal coronary origins.  Right dominance.

Right Coronary Artery: Normal caliber vessel, gives rise to PDA. No
significant plaque or stenosis.

Left Main Coronary Artery: Normal caliber vessel. No significant
plaque or stenosis.

Left Anterior Descending Coronary Artery: Normal caliber vessel.
Trivial calcified plaque in the proximal LAD with minimal stenosis
(1-24%). Gives rise to 3 diagonal branches, with D1 being a large
branch with sub-branches.

Left Circumflex Artery: Normal caliber vessel. No significant plaque
or stenosis. Gives rise to 4 OM branches. OM1 is nearly a ramus
intermedius.

Aorta: Normal size, 38 mm at the mid ascending aorta (level of the
PA bifurcation) measured double oblique. No calcifications. No
dissection.

Aortic Valve: No calcifications. Trileaflet.

Other findings:

Normal pulmonary vein drainage into the left atrium.

Normal left atrial appendage without a thrombus.

Normal size of the pulmonary artery.

Small PFO without evidence of shunting.
IMPRESSION: 1.  Minimal nonobstructive CAD, CADRADS = 1.

2. Coronary calcium score of 2. This was 2nd percentile for age and
sex matched control.

3. Normal coronary origin with right dominance.

4.  Small PFO without evidence of shunting.

INTERPRETATION:

1. CAD-RADS 0: No evidence of CAD (0%). Consider non-atherosclerotic
causes of chest pain.

2. CAD-RADS 1: Minimal non-obstructive CAD (0-24%). Consider
non-atherosclerotic causes of chest pain. Consider preventive
therapy and risk factor modification.

3. CAD-RADS 2: Mild non-obstructive CAD (25-49%). Consider
non-atherosclerotic causes of chest pain. Consider preventive
therapy and risk factor modification.

4. CAD-RADS 3: Moderate stenosis (50-69%). Consider symptom-guided
anti-ischemic pharmacotherapy as well as risk factor modification
per guideline directed care. Additional analysis with CT FFR will be
submitted.

5. CAD-RADS 4: Severe stenosis. (70-99% or > 50% left main). Cardiac
catheterization or CT FFR is recommended. Consider symptom-guided
anti-ischemic pharmacotherapy as well as risk factor modification
per guideline directed care. Invasive coronary angiography
recommended with revascularization per published guideline
statements.

6. CAD-RADS 5: Total coronary occlusion (100%). Consider cardiac
catheterization or viability assessment. Consider symptom-guided
anti-ischemic pharmacotherapy as well as risk factor modification
per guideline directed care.

7. CAD-RADS N: Non-diagnostic study. Obstructive CAD can't be
excluded. Alternative evaluation is recommended.

*** End of Addendum ***
EXAM:
OVER-READ INTERPRETATION  CT CHEST

The following report is an over-read performed by radiologist Dr.
Deontae Pringle [REDACTED] on 08/07/2020. This over-read
does not include interpretation of cardiac or coronary anatomy or
pathology. The coronary calcium score/coronary CTA interpretation by
the cardiologist is attached.
FINDINGS: Vascular: Ascending thoracic aorta measures 3.8 cm. No evidence for
a dissection involving the visualized thoracic aorta. Pulmonary
arteries are poorly opacified on this examination.

Mediastinum/Nodes: Visualized mediastinal structures are
unremarkable.

Lungs/Pleura: Dependent atelectasis in the lower lobes. No large
pleural effusions.

Upper Abdomen: Visualized upper abdominal structures are
unremarkable.

Musculoskeletal: No acute bone abnormality.
IMPRESSION: No acute abnormality involving the extracardiac structures.

## 2020-08-07 MED ORDER — NITROGLYCERIN 0.4 MG SL SUBL
SUBLINGUAL_TABLET | SUBLINGUAL | Status: AC
  Filled 2020-08-07: qty 2

## 2020-08-07 MED ORDER — NITROGLYCERIN 0.4 MG SL SUBL
0.8000 mg | SUBLINGUAL_TABLET | Freq: Once | SUBLINGUAL | Status: AC
  Administered 2020-08-07: 0.8 mg via SUBLINGUAL

## 2020-08-07 MED ORDER — IOHEXOL 350 MG/ML SOLN
90.0000 mL | Freq: Once | INTRAVENOUS | Status: AC | PRN
  Administered 2020-08-07: 90 mL via INTRAVENOUS

## 2020-08-07 NOTE — Research (Signed)
IDENTIFY Informed Consent   Subject Name: Timothy Watts  Subject met inclusion and exclusion criteria.  The informed consent form, study requirements and expectations were reviewed with the subject and questions and concerns were addressed prior to the signing of the consent form.  The subject verbalized understanding of the trial requirements.  The subject agreed to participate in the IDENTIFY trial and signed the informed consent at 0705 on 08/07/2020.  The informed consent was obtained prior to performance of any protocol-specific procedures for the subject.  A copy of the signed informed consent was given to the subject and a copy was placed in the subject's medical record.   Philemon Kingdom D

## 2020-08-21 ENCOUNTER — Ambulatory Visit (HOSPITAL_COMMUNITY): Payer: No Typology Code available for payment source | Attending: Cardiology

## 2020-08-21 ENCOUNTER — Other Ambulatory Visit: Payer: Self-pay

## 2020-08-21 DIAGNOSIS — I447 Left bundle-branch block, unspecified: Secondary | ICD-10-CM | POA: Diagnosis present

## 2020-08-21 DIAGNOSIS — R9431 Abnormal electrocardiogram [ECG] [EKG]: Secondary | ICD-10-CM

## 2020-08-21 DIAGNOSIS — R079 Chest pain, unspecified: Secondary | ICD-10-CM | POA: Diagnosis present

## 2020-08-21 DIAGNOSIS — R0600 Dyspnea, unspecified: Secondary | ICD-10-CM

## 2020-08-21 LAB — ECHOCARDIOGRAM COMPLETE
Area-P 1/2: 2.62 cm2
S' Lateral: 1.9 cm

## 2020-09-16 NOTE — Progress Notes (Signed)
Cardiology Office Note   Date:  09/19/2020   ID:  Timothy Watts, DOB 01/12/1956, MRN [id number]  PCP:  Timothy Battles, MD  Cardiologist: Dr. Johnsie Cancel, MD    Chief Complaint  Patient presents with  . Follow-up   History of Present Illness: Timothy Watts is a 65 y.o. male who presents for follow up, seen for Dr. Johnsie Watts.  Timothy Watts was initially referred for an abnormal EKG with exertional dyspnea performed insurance and physical.  He is very active at baseline, rides an elliptical 3 times a week, plays golf.  He has no family history of premature CAD.  LDL on last lipid panel was 127.  He did have a abdominal CT in 2019 which showed some aortic atherosclerosis.  He was last seen by Dr. Johnsie Watts 07/19/2020 at which time EKG was noted to have new LBBB therefore an echocardiogram was performed which showed an LVEF at 65 to 70% with no regional wall motion abnormalities and G1 DD with no evidence of valvular disease.  CTA performed 08/07/2020 that showed a coronary calcium score of 2 which places the patient in the 2nd percentile for age and sex matched control.  There was normal coronary artery origin with very minimal nonobstructive CAD.  There was noted a small PFO without evidence of shunting.   Today he presents for follow up of testing and states that he is doing very well from a CV standpoint. He continues to have some DOE however now that his CV tests have come back normal, feels that it could be from asthma as it typically occurs in very hot/humid weather. We discussed the possibility of adding an inhaler per his PCP if needed. BP is great today. Remains active an is not on any medications.   Past Medical History:  Diagnosis Date  . Abnormal EKG   . Atherosclerosis of aorta ([place])   . Axillary mass    left  . Benign skin mole   . Chest pain   . CKD (chronic kidney disease), stage II   . Dyspnea on exertion   . Elbow pain, chronic, right   . Flank pain   . GERD (gastroesophageal  reflux disease)   . Hematuria   . Hemorrhoids   . Hyperlipidemia   . Paresthesia   . Precordial pain   . PSA elevation   . PSA elevation   . RUQ pain   . Shoulder pain   . Snoring   . SOB (shortness of breath) on exertion   . Swelling     Past Surgical History:  Procedure Laterality Date  . HERNIA REPAIR     UHR  . MASS EXCISION Left 10/04/2015   Procedure: EXCISION OF LEFT AXILLARY MASS;  Surgeon: Stark Klein, MD;  Location: [facility];  Service: General;  Laterality: Left;     No current outpatient medications on file.   No current facility-administered medications for this visit.    Allergies:   Patient has no known allergies.    Social History:  The patient  reports that he has never smoked. He has never used smokeless tobacco. He reports current alcohol use. He reports that he does not use drugs.   Family History:  The patient's family history includes Pancreatic cancer in his mother; Stroke in his father; Throat cancer in his sister.    ROS:  Please see the history of present illness. Otherwise, review of systems are positive for none.   All other systems are  reviewed and negative.    PHYSICAL EXAM: VS:  BP 110/70   Pulse 63   Ht 5' 9.5" (1.765 m)   Wt 175 lb 3.2 oz (79.5 kg)   SpO2 99%   BMI 25.50 kg/m  , BMI Body mass index is 25.5 kg/m.   General: Well developed, well nourished, NAD Neck: Negative for carotid bruits. No JVD Lungs:Clear to ausculation bilaterally. No wheezes, rales, or rhonchi. Breathing is unlabored. Cardiovascular: RRR with S1 S2. No murmurs Extremities: No edema Neuro: Alert and oriented. No focal deficits. No facial asymmetry. MAE spontaneously. Psych: Responds to questions appropriately with normal affect.    EKG:  EKG is not ordered today.  Recent Labs: 07/31/2020: BUN 17; Creatinine, Ser 1.07; Potassium 4.2; Sodium 142    Lipid Panel No results found for: CHOL, TRIG, HDL, CHOLHDL, VLDL, LDLCALC, LDLDIRECT    Wt Readings from Last 3 Encounters:  09/19/20 175 lb 3.2 oz (79.5 kg)  07/19/20 176 lb (79.8 kg)  10/04/15 174 lb 8 oz (79.2 kg)     Other studies Reviewed: Additional studies/ records that were reviewed today include:  Review of the above records demonstrates:   Echocardiogram 08/21/2020:  1. Left ventricular ejection fraction, by estimation, is 65 to 70%. The  left ventricle has normal function. The left ventricle has no regional  wall motion abnormalities. Left ventricular diastolic parameters are  consistent with Grade I diastolic  dysfunction (impaired relaxation). Elevated left ventricular end-diastolic  pressure.  2. Right ventricular systolic function is normal. The right ventricular  size is normal. There is normal pulmonary artery systolic pressure. The  estimated right ventricular systolic pressure is 95.6 mmHg.  3. The mitral valve is normal in structure. No evidence of mitral valve  regurgitation. No evidence of mitral stenosis.  4. The aortic valve is normal in structure. Aortic valve regurgitation is  not visualized. No aortic stenosis is present.  5. The inferior vena cava is normal in size with greater than 50%  respiratory variability, suggesting right atrial pressure of 3 mmHg.   Coronary CTA 08/07/2020:  IMPRESSION: 1.  Minimal nonobstructive CAD, CADRADS = 1.  2. Coronary calcium score of 2. This was 2nd percentile for age and sex matched control.  3. Normal coronary origin with right dominance.  4.  Small PFO without evidence of shunting.  ASSESSMENT AND PLAN:  1.  History of abnormal EKG with LBBB: -Patient recently underwent an echocardiogram and a coronary CTA which were both found to be normal  -There is note of a small PFO on CCTA therefore I will discuss with Dr. Johnsie Watts if this requires further workup -Given very mild nonobstructive CAD, he is asking about ASA which seems appropriate  2.  Dyspnea: -Not felt to be cardiac -Plans to  follow with PCP for possible addition of inhaler for hot weather and exercise.   3.  HLD: -LDL noted to be 127 -Follows with PCP -Does not want to start statin at this time   Current medicines are reviewed at length with the patient today.  The patient does not have concerns regarding medicines.  The following changes have been made:  no change  Labs/ tests ordered today include: None  No orders of the defined types were placed in this encounter.   Disposition:   FU with Dr. Johnsie Watts in 1 year  Signed, Kathyrn Drown, NP  09/19/2020 10:43 AM    [address], [place], [place]  [address] Phone: [phone])  [phone]; Fax: [phone]

## 2020-09-19 ENCOUNTER — Other Ambulatory Visit: Payer: Self-pay

## 2020-09-19 ENCOUNTER — Encounter: Payer: Self-pay | Admitting: Cardiology

## 2020-09-19 ENCOUNTER — Ambulatory Visit (INDEPENDENT_AMBULATORY_CARE_PROVIDER_SITE_OTHER): Payer: No Typology Code available for payment source | Admitting: Cardiology

## 2020-09-19 VITALS — BP 110/70 | HR 63 | Ht 69.5 in | Wt 175.2 lb

## 2020-09-19 DIAGNOSIS — R0600 Dyspnea, unspecified: Secondary | ICD-10-CM

## 2020-09-19 DIAGNOSIS — I10 Essential (primary) hypertension: Secondary | ICD-10-CM | POA: Diagnosis not present

## 2020-09-19 DIAGNOSIS — I447 Left bundle-branch block, unspecified: Secondary | ICD-10-CM

## 2020-09-19 NOTE — Patient Instructions (Addendum)
Medication Instructions:  Your physician recommends that you continue on your current medications as directed. Please refer to the Current Medication list given to you today.  *If you need a refill on your cardiac medications before your next appointment, please call your pharmacy*   Lab Work: NONE If you have labs (blood work) drawn today and your tests are completely normal, you will receive your results only by: Marland Kitchen MyChart Message (if you have MyChart) OR . A paper copy in the mail If you have any lab test that is abnormal or we need to change your treatment, we will call you to review the results.   Testing/Procedures: NONE   Follow-Up: At [facility], you and your health needs are our priority.  As part of our continuing mission to provide you with exceptional heart care, we have created designated Provider Care Teams.  These Care Teams include your primary Cardiologist (physician) and Advanced Practice Providers (APPs -  Physician Assistants and Nurse Practitioners) who all work together to provide you with the care you need, when you need it.  We recommend signing up for the patient portal called "MyChart".  Sign up information is provided on this After Visit Summary.  MyChart is used to connect with patients for Virtual Visits (Telemedicine).  Patients are able to view lab/test results, encounter notes, upcoming appointments, etc.  Non-urgent messages can be sent to your provider as well.   To learn more about what you can do with MyChart, go to [email or web address].    Your next appointment:   12 MONTHS  The format for your next appointment:   In Person  Provider:   Jenkins Rouge, MD

## 2022-01-13 DIAGNOSIS — Z0000 Encounter for general adult medical examination without abnormal findings: Secondary | ICD-10-CM | POA: Diagnosis not present

## 2022-01-13 DIAGNOSIS — R82998 Other abnormal findings in urine: Secondary | ICD-10-CM | POA: Diagnosis not present

## 2022-01-13 DIAGNOSIS — Z125 Encounter for screening for malignant neoplasm of prostate: Secondary | ICD-10-CM | POA: Diagnosis not present

## 2022-01-13 DIAGNOSIS — E785 Hyperlipidemia, unspecified: Secondary | ICD-10-CM | POA: Diagnosis not present

## 2022-01-20 DIAGNOSIS — Z0000 Encounter for general adult medical examination without abnormal findings: Secondary | ICD-10-CM | POA: Diagnosis not present

## 2022-01-20 DIAGNOSIS — I700 Atherosclerosis of aorta: Secondary | ICD-10-CM | POA: Diagnosis not present

## 2022-08-19 ENCOUNTER — Encounter: Payer: Self-pay | Admitting: Physician Assistant

## 2022-08-19 ENCOUNTER — Ambulatory Visit: Payer: BC Managed Care – PPO | Attending: Physician Assistant | Admitting: Physician Assistant

## 2022-08-19 VITALS — BP 126/82 | HR 65 | Ht 69.5 in | Wt 178.2 lb

## 2022-08-19 DIAGNOSIS — R42 Dizziness and giddiness: Secondary | ICD-10-CM | POA: Diagnosis not present

## 2022-08-19 DIAGNOSIS — E785 Hyperlipidemia, unspecified: Secondary | ICD-10-CM | POA: Diagnosis not present

## 2022-08-19 DIAGNOSIS — R55 Syncope and collapse: Secondary | ICD-10-CM

## 2022-08-19 DIAGNOSIS — R0600 Dyspnea, unspecified: Secondary | ICD-10-CM | POA: Diagnosis not present

## 2022-08-19 DIAGNOSIS — I447 Left bundle-branch block, unspecified: Secondary | ICD-10-CM | POA: Diagnosis not present

## 2022-08-19 NOTE — Progress Notes (Signed)
Office Visit    Patient Name: Timothy Watts Date of Encounter: 08/19/2022  PCP:  Garlan Fillers, MD   [facility]  Cardiologist:  Charlton Haws, MD  Advanced Practice Provider:  No care team member to display Electrophysiologist:  None   HPI    Timothy Watts is a 67 y.o. male with CKD stage II, dyspnea on exertion, GERD, hyperlipidemia presents today for follow-up appointment.  He was initially referred for abnormal EKG with exertional dyspnea performed for an insurance physical.  He is very active at baseline, rides an elliptical 3 times a week and plays golf.  He has no family history of premature CAD.  LDL was 127 on last lipid panel.  He did have an abdominal CT in 2019 which showed some aortic atherosclerosis.  He was seen by Dr. Admission 07/15/2020 at that time EKG was noted to have new LBBB therefore, an echocardiogram was performed which showed LVEF 65 to 70% with no regional wall motion abnormalities, G1 DD with no evidence of valvular disease.  CTA performed 08/07/2020 showed a coronary calcium score of 2 which places the patient in 2nd percentile for age, sex matched controls.  There was normal coronary artery origin with very minimal nonobstructive CAD.  There was a noted small PFO without evidence of shunting.  The patient was last seen by Georgie Chard, NP on 09/19/2020 and at that time was doing well from a CV standpoint.  Continue to have some DOE however now that his CV tests have come back normal, feels it could be from asthma as it typically occurs in very hot/humid weather.  Discussed possibility of adding an inhaler per his PCP if needed.  BP was well-controlled that day.  Remains active.  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  Midway through the back she got dizzy and cannot shake it off.  He had to retire from the match.  Hands were pale and felt as though he was going to pass out.  He was very  concerned at this point checked his blood pressure after he got home and it was fine.  He does get winded when going up a flight of steps with something heavy in his hands and walking up a severe incline.  This has been happening for over 2 years.  He was recently at his primary care office and was prescribed an inhaler.  He thinks maybe his symptoms were due to dehydration potentially.  Also questioned whether he has fluid on his lungs since this happened to a friend.  Reports no chest pain, pressure, or tightness. No edema, orthopnea, PND. Reports no palpitations.   Past Medical History    Past Medical History:  Diagnosis Date   Abnormal EKG    Atherosclerosis of aorta    Axillary mass    left   Benign skin mole    Chest pain    CKD (chronic kidney disease), stage II    Dyspnea on exertion    Elbow pain, chronic, right    Flank pain    GERD (gastroesophageal reflux disease)    Hematuria    Hemorrhoids    Hyperlipidemia    Paresthesia    Precordial pain    PSA elevation    PSA elevation    RUQ pain    Shoulder pain    Snoring    SOB (shortness of breath) on exertion  Swelling    Past Surgical History:  Procedure Laterality Date   HERNIA REPAIR     UHR   MASS EXCISION Left 10/04/2015   Procedure: EXCISION OF LEFT AXILLARY MASS;  Surgeon: Almond Lint, MD;  Location: [facility];  Service: General;  Laterality: Left;    Allergies  No Known Allergies  EKGs/Labs/Other Studies Reviewed:   The following studies were reviewed today:  Echocardiogram 08/21/2020:   1. Left ventricular ejection fraction, by estimation, is 65 to 70%. The  left ventricle has normal function. The left ventricle has no regional  wall motion abnormalities. Left ventricular diastolic parameters are  consistent with Grade I diastolic  dysfunction (impaired relaxation). Elevated left ventricular end-diastolic  pressure.   2. Right ventricular systolic function is normal. The right  ventricular  size is normal. There is normal pulmonary artery systolic pressure. The  estimated right ventricular systolic pressure is 25.1 mmHg.   3. The mitral valve is normal in structure. No evidence of mitral valve  regurgitation. No evidence of mitral stenosis.   4. The aortic valve is normal in structure. Aortic valve regurgitation is  not visualized. No aortic stenosis is present.   5. The inferior vena cava is normal in size with greater than 50%  respiratory variability, suggesting right atrial pressure of 3 mmHg.    Coronary CTA 08/07/2020:   IMPRESSION: 1.  Minimal nonobstructive CAD, CADRADS = 1.   2. Coronary calcium score of 2. This was 2nd percentile for age and sex matched control.   3. Normal coronary origin with right dominance.   4.  Small PFO without evidence of shunting  EKG:  EKG is  ordered today.  The ekg ordered today demonstrates normal sinus rhythm, rate 65 bpm, possible partial LBBB (lead v5)  Recent Labs: No results found for requested labs within last 365 days.  Recent Lipid Panel No results found for: "CHOL", "TRIG", "HDL", "CHOLHDL", "VLDL", "LDLCALC", "LDLDIRECT" Home Medications   Current Meds  Medication Sig   aspirin EC 81 MG tablet Take 81 mg by mouth daily. Swallow whole.     Review of Systems      All other systems reviewed and are otherwise negative except as noted above.  Physical Exam    VS:  BP 126/82   Pulse 65   Ht 5' 9.5" (1.765 m)   Wt 178 lb 3.2 oz (80.8 kg)   SpO2 98%   BMI 25.94 kg/m  , BMI Body mass index is 25.94 kg/m.  Wt Readings from Last 3 Encounters:  08/19/22 178 lb 3.2 oz (80.8 kg)  09/19/20 175 lb 3.2 oz (79.5 kg)  07/19/20 176 lb (79.8 kg)     GEN: Well nourished, well developed, in no acute distress. HEENT: normal. Neck: Supple, no JVD, carotid bruits, or masses. Cardiac: RRR, no murmurs, rubs, or gallops. No clubbing, cyanosis, edema.  Radials/PT 2+ and equal bilaterally.  Respiratory:   Respirations regular and unlabored, clear to auscultation bilaterally. GI: Soft, nontender, nondistended. MS: No deformity or atrophy. Skin: Warm and dry, no rash. Neuro:  Strength and sensation are intact. Psych: Normal affect.  Assessment & Plan    History of abnormal EKG and LBBB -EKG reviewed with patient today and looks stable  Dyspnea -This has been occurring for over 2 years but this was the first time he experienced 3 syncopal symptoms -Will plan to update an echocardiogram -Will start the patient on ASA 81 mg daily -If echocardiogram is much different than  previous might consider an ischemic workup but will defer for now -Not currently taking any medications  Hyperlipidemia -Not currently needing any medications -Presented with lab work from his PCP with LDL 119 and triglycerides within range -Would suggest annually monitoring and pursuing a lipid-lowering diet       Disposition: Follow up 6 months with Charlton Haws, MD or APP.  Signed, Sharlene Dory, PA-C 08/19/2022, 1:57 PM [facility]

## 2022-08-19 NOTE — Patient Instructions (Signed)
Medication Instructions:  1.Start taking over the counter Aspirin 81 mg daily. *If you need a refill on your cardiac medications before your next appointment, please call your pharmacy*   Lab Work: None If you have labs (blood work) drawn today and your tests are completely normal, you will receive your results only by: MyChart Message (if you have MyChart) OR A paper copy in the mail If you have any lab test that is abnormal or we need to change your treatment, we will call you to review the results.   Testing/Procedures: Your physician has requested that you have an echocardiogram. Echocardiography is a painless test that uses sound waves to create images of your heart. It provides your doctor with information about the size and shape of your heart and how well your heart's chambers and valves are working. This procedure takes approximately one hour. There are no restrictions for this procedure. Please do NOT wear cologne, perfume, aftershave, or lotions (deodorant is allowed). Please arrive 15 minutes prior to your appointment time.    Follow-Up: At [facility], you and your health needs are our priority.  As part of our continuing mission to provide you with exceptional heart care, we have created designated Provider Care Teams.  These Care Teams include your primary Cardiologist (physician) and Advanced Practice Providers (APPs -  Physician Assistants and Nurse Practitioners) who all work together to provide you with the care you need, when you need it.  Your next appointment:   6 month(s)  Provider:   Charlton Haws, MD  or Jari Favre, PA-C

## 2022-09-15 ENCOUNTER — Ambulatory Visit (HOSPITAL_COMMUNITY): Payer: BC Managed Care – PPO | Attending: Physician Assistant

## 2022-09-15 DIAGNOSIS — R42 Dizziness and giddiness: Secondary | ICD-10-CM | POA: Diagnosis not present

## 2022-09-15 DIAGNOSIS — R55 Syncope and collapse: Secondary | ICD-10-CM

## 2022-09-15 LAB — ECHOCARDIOGRAM COMPLETE
Area-P 1/2: 3.17 cm2
S' Lateral: 2.4 cm

## 2022-10-28 DIAGNOSIS — L853 Xerosis cutis: Secondary | ICD-10-CM | POA: Diagnosis not present

## 2022-10-28 DIAGNOSIS — D2262 Melanocytic nevi of left upper limb, including shoulder: Secondary | ICD-10-CM | POA: Diagnosis not present

## 2022-10-28 DIAGNOSIS — D225 Melanocytic nevi of trunk: Secondary | ICD-10-CM | POA: Diagnosis not present

## 2022-10-28 DIAGNOSIS — L814 Other melanin hyperpigmentation: Secondary | ICD-10-CM | POA: Diagnosis not present

## 2023-11-13 ENCOUNTER — Encounter: Payer: Self-pay | Admitting: Advanced Practice Midwife
